# Patient Record
Sex: Male | Born: 1965 | Race: White | Hispanic: No | Marital: Married | Smoking: Never smoker
Health system: Southern US, Community
[De-identification: ages and names within clinical notes are randomized; demographics above are authoritative.]

## PROBLEM LIST (undated history)

## (undated) DIAGNOSIS — I10 Essential (primary) hypertension: Secondary | ICD-10-CM

## (undated) DIAGNOSIS — D126 Benign neoplasm of colon, unspecified: Secondary | ICD-10-CM

## (undated) HISTORY — DX: Essential (primary) hypertension: I10

## (undated) HISTORY — DX: Benign neoplasm of colon, unspecified: D12.6

---

## 1998-08-03 ENCOUNTER — Encounter: Payer: Self-pay | Admitting: Emergency Medicine

## 1998-08-03 ENCOUNTER — Emergency Department (HOSPITAL_COMMUNITY): Admission: EM | Admit: 1998-08-03 | Discharge: 1998-08-03 | Payer: Self-pay | Admitting: Emergency Medicine

## 1998-08-05 ENCOUNTER — Emergency Department (HOSPITAL_COMMUNITY): Admission: EM | Admit: 1998-08-05 | Discharge: 1998-08-05 | Payer: Self-pay | Admitting: Emergency Medicine

## 2001-12-04 ENCOUNTER — Encounter: Payer: Self-pay | Admitting: Family Medicine

## 2001-12-04 ENCOUNTER — Ambulatory Visit (HOSPITAL_COMMUNITY): Admission: RE | Admit: 2001-12-04 | Discharge: 2001-12-04 | Payer: Self-pay | Admitting: Family Medicine

## 2003-11-22 ENCOUNTER — Emergency Department (HOSPITAL_COMMUNITY): Admission: EM | Admit: 2003-11-22 | Discharge: 2003-11-22 | Payer: Self-pay | Admitting: Emergency Medicine

## 2004-03-16 ENCOUNTER — Emergency Department (HOSPITAL_COMMUNITY): Admission: EM | Admit: 2004-03-16 | Discharge: 2004-03-16 | Payer: Self-pay | Admitting: Emergency Medicine

## 2012-09-23 DIAGNOSIS — I10 Essential (primary) hypertension: Secondary | ICD-10-CM

## 2012-09-23 HISTORY — DX: Essential (primary) hypertension: I10

## 2014-02-15 ENCOUNTER — Encounter (HOSPITAL_COMMUNITY): Payer: Self-pay | Admitting: Emergency Medicine

## 2014-02-15 ENCOUNTER — Emergency Department (HOSPITAL_COMMUNITY)
Admission: EM | Admit: 2014-02-15 | Discharge: 2014-02-15 | Disposition: A | Discharge status: Home / Self Care | Payer: BC Managed Care – PPO | Attending: Emergency Medicine | Admitting: Emergency Medicine

## 2014-02-15 ENCOUNTER — Emergency Department (HOSPITAL_COMMUNITY): Payer: BC Managed Care – PPO

## 2014-02-15 DIAGNOSIS — R1032 Left lower quadrant pain: Secondary | ICD-10-CM | POA: Insufficient documentation

## 2014-02-15 DIAGNOSIS — R109 Unspecified abdominal pain: Secondary | ICD-10-CM

## 2014-02-15 DIAGNOSIS — Z880 Allergy status to penicillin: Secondary | ICD-10-CM | POA: Insufficient documentation

## 2014-02-15 LAB — COMPREHENSIVE METABOLIC PANEL
ALBUMIN: 3.9 g/dL (ref 3.5–5.2)
ALT: 30 U/L (ref 0–53)
AST: 24 U/L (ref 0–37)
Alkaline Phosphatase: 66 U/L (ref 39–117)
BUN: 16 mg/dL (ref 6–23)
CALCIUM: 9.3 mg/dL (ref 8.4–10.5)
CO2: 21 mEq/L (ref 19–32)
CREATININE: 0.83 mg/dL (ref 0.50–1.35)
Chloride: 104 mEq/L (ref 96–112)
GFR calc Af Amer: >90 mL/min (ref >90)
GFR calc non Af Amer: >90 mL/min (ref >90)
Glucose, Bld: 106 mg/dL — ABNORMAL HIGH (ref 70–99)
Potassium: 4.7 mEq/L (ref 3.7–5.3)
Sodium: 139 mEq/L (ref 137–147)
Total Bilirubin: 0.3 mg/dL (ref 0.3–1.2)
Total Protein: 7.2 g/dL (ref 6.0–8.3)

## 2014-02-15 LAB — CBC WITH DIFFERENTIAL/PLATELET
BASOS ABS: 0 10*3/uL (ref 0.0–0.1)
BASOS PCT: 1 % (ref 0–1)
Eosinophils Absolute: 0.1 10*3/uL (ref 0.0–0.7)
Eosinophils Relative: 1 % (ref 0–5)
HEMATOCRIT: 43.4 % (ref 39.0–52.0)
Hemoglobin: 15.3 g/dL (ref 13.0–17.0)
Lymphocytes Relative: 35 % (ref 12–46)
Lymphs Abs: 2 10*3/uL (ref 0.7–4.0)
MCH: 29.7 pg (ref 26.0–34.0)
MCHC: 35.3 g/dL (ref 30.0–36.0)
MCV: 84.1 fL (ref 78.0–100.0)
Monocytes Absolute: 0.4 10*3/uL (ref 0.1–1.0)
Monocytes Relative: 7 % (ref 3–12)
Neutro Abs: 3.3 10*3/uL (ref 1.7–7.7)
Neutrophils Relative %: 56 % (ref 43–77)
Platelets: 160 10*3/uL (ref 150–400)
RBC: 5.16 MIL/uL (ref 4.22–5.81)
RDW: 13.1 % (ref 11.5–15.5)
WBC: 5.8 10*3/uL (ref 4.0–10.5)

## 2014-02-15 LAB — URINALYSIS, ROUTINE W REFLEX MICROSCOPIC
Bilirubin Urine: NEGATIVE
GLUCOSE, UA: NEGATIVE mg/dL
Hgb urine dipstick: NEGATIVE
Ketones, ur: NEGATIVE mg/dL
LEUKOCYTES UA: NEGATIVE
Nitrite: NEGATIVE
PH: 7.5 (ref 5.0–8.0)
PROTEIN: NEGATIVE mg/dL
Specific Gravity, Urine: 1.015 (ref 1.005–1.030)
Urobilinogen, UA: 1 mg/dL (ref 0.0–1.0)

## 2014-02-15 LAB — LIPASE, BLOOD: Lipase: 22 U/L (ref 11–59)

## 2014-02-15 MED ORDER — IOHEXOL 300 MG/ML  SOLN
25.0000 mL | INTRAMUSCULAR | Status: DC | PRN
  Administered 2014-02-15: 25 mL via ORAL

## 2014-02-15 MED ORDER — IOHEXOL 300 MG/ML  SOLN
100.0000 mL | Freq: Once | INTRAMUSCULAR | Status: AC | PRN
  Administered 2014-02-15: 100 mL via INTRAVENOUS

## 2014-02-15 MED ORDER — NAPROXEN 500 MG PO TABS: 500.0000 mg | ORAL_TABLET | Freq: Two times a day (BID) | ORAL | Status: AC

## 2014-02-15 NOTE — ED Notes (Signed)
Pt to ED c/o of L groin pain radiating to L flank and back x 2 days. Pt reports sudden sharp pain on Friday that has been intermittent since then. Denies urinary issues

## 2014-02-15 NOTE — ED Provider Notes (Signed)
Medical screening examination/treatment/procedure(s) were performed by non-physician practitioner and as supervising physician I was immediately available for consultation/collaboration.   EKG Interpretation None        Hoy Morn, MD 02/15/14 1344

## 2014-02-15 NOTE — ED Provider Notes (Signed)
CSN: [id number]     Arrival date & time 02/15/14  3235 History   First MD Initiated Contact with Patient 02/15/14 [phone]     Chief Complaint  Patient presents with  . Flank Pain     (Consider location/radiation/quality/duration/timing/severity/associated sxs/prior Treatment) HPI Comments: Patient with h./o GERD -- presents with complaint of left flank and left lower abdominal pain for the past 3 days. Patient states that the pain came on gradually (contradicting the nursing note). Patient has some pain with having a bowel movement but the pain is improved once bowel movement has passed. No blood noted in stool, no melena. No urinary symptoms including dysuria or hematuria. No fever, nausea, vomiting. Patient had a colonoscopy 2 years ago which was negative except for a polyp. He does not remember being told that he had diverticulosis. Pain is made worse with movement and palpation. Course is constant. Over-the-counter Tylenol and ibuprofen has not helped the pain.  The history is provided by the patient.    History reviewed. No pertinent past medical history. History reviewed. No pertinent past surgical history. History reviewed. No pertinent family history. History  Substance Use Topics  . Smoking status: Never Smoker   . Smokeless tobacco: Not on file  . Alcohol Use: No    Review of Systems  Constitutional: Negative for fever.  HENT: Negative for rhinorrhea and sore throat.   Eyes: Negative for redness.  Respiratory: Negative for cough.   Cardiovascular: Negative for chest pain.  Gastrointestinal: Positive for abdominal pain. Negative for nausea, vomiting, diarrhea and blood in stool.  Genitourinary: Positive for flank pain. Negative for dysuria.  Musculoskeletal: Negative for myalgias.  Skin: Negative for rash.  Neurological: Negative for headaches.      Allergies  Penicillins  Home Medications   Prior to Admission medications   Not on File   BP 154/84  Pulse 76   Temp(Src) 97.6 F (36.4 C) (Oral)  Resp 18  Ht 5\' 9"  (1.753 m)  Wt 242 lb (109.77 kg)  BMI 35.72 kg/m2  SpO2 92%  Physical Exam  Nursing note and vitals reviewed. Constitutional: He appears well-developed and well-nourished.  HENT:  Head: Normocephalic and atraumatic.  Eyes: Conjunctivae are normal. Right eye exhibits no discharge. Left eye exhibits no discharge.  Neck: Normal range of motion. Neck supple.  Cardiovascular: Normal rate, regular rhythm and normal heart sounds.   Pulmonary/Chest: Effort normal and breath sounds normal.  Abdominal: Soft. There is tenderness (moderate) in the left lower quadrant. There is no rigidity, no rebound, no guarding, no CVA tenderness, no tenderness at McBurney's point and negative Murphy's sign.  Musculoskeletal:       Thoracic back: Normal. He exhibits normal range of motion, no tenderness and no bony tenderness.       Lumbar back: Normal. He exhibits normal range of motion, no tenderness and no bony tenderness.  Neurological: He is alert.  Skin: Skin is warm and dry.  Psychiatric: He has a normal mood and affect.    ED Course  Procedures (including critical care time) Labs Review Labs Reviewed  COMPREHENSIVE METABOLIC PANEL - Abnormal; Notable for the following:    Glucose, Bld 106 (*)    All other components within normal limits  CBC WITH DIFFERENTIAL  URINALYSIS, ROUTINE W REFLEX MICROSCOPIC  LIPASE, BLOOD    Imaging Review Ct Abdomen Pelvis W Contrast  02/15/2014   CLINICAL DATA:  Left groin pain radiating to left flank and back for 2 days, with left lower quadrant  pain intermittently for 2 days, concern for diverticulitis  EXAM: CT ABDOMEN AND PELVIS WITH CONTRAST  TECHNIQUE: Multidetector CT imaging of the abdomen and pelvis was performed using the standard protocol following bolus administration of intravenous contrast.  CONTRAST:  166mL OMNIPAQUE IOHEXOL 300 MG/ML  SOLN  COMPARISON:  None.  FINDINGS: Gallbladder is normal. Image  number 12 lateral right lobe of the liver there is a 11 mm low-attenuation lesion, too small to characterize. Spleen is normal. Pancreas is normal.  There is a 6 mm left adrenal nodule, likely benign. Right adrenal is normal. Left kidney is normal with no stones or hydronephrosis. Right kidney is also normal.  Abdominal aorta shows mild calcification with no of patient. Bladder is normal. Reproductive organs are normal.  There is no ascites. There are several retroperitoneal lymph nodes, mesenteric lymph nodes as well as lymph nodes in the region of the porta hepatis and celiac axis. The largest is in the region of the portal vein measuring 14 mm.  The visualized portions of the lung bases are clear. There are no acute musculoskeletal findings.  Bowel is normal. There is mild sigmoid colon diverticulosis with no evidence of diverticulitis. Appendix is normal.  IMPRESSION: No acute abnormality.  Incidental 5 mm region of the liver not fully characterized but likely benign.  11 mm left adrenal apex nodule, likely benign. If there is no history of malignancy, a follow-up CT scan or MRI of the adrenal glands in 1 year could be considered. If there is any history of malignancy, this could be further investigated at this time with nonemergent outpatient adrenal MRI. This recommendation follows ACR consensus guidelines: Managing Incidental Findings on Abdominal CT: White Paper of the ACR Incidental Findings Committee. J Am Coll Radiol 2010;7:754-773  Nonspecific abdominal adenopathy.   Electronically Signed   By: Skipper Cliche M.D.   On: 02/15/2014 11:42     EKG Interpretation None       9:48 AM Patient seen and examined. Work-up initiated. Medications refused. CT ordered.   Vital signs reviewed and are as follows: Filed Vitals:   02/15/14 0933  BP: 154/84  Pulse: 76  Temp: 97.6 F (36.4 C)  Resp: 18   12:17 PM the patient's symptoms and exam are stable. Patient informed of lab results and CT results.  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 He sees clinic in [place]. Will discharge to home with anti-inflammatories. Patient counseled to use heating pad if desired on tender area. Discussed that he should followup with his PCP for a recheck of his symptoms the next 2-3 days. The patient was urged to return to the Emergency Department immediately with worsening of current symptoms, worsening abdominal pain, persistent vomiting, blood noted in stools, fever, or any other concerns. The patient verbalized understanding.    MDM   Final diagnoses:  Abdominal pain   Patient with left flank and lower abdominal pain. Labs are reassuring. No leukocytosis. CT scan performed -- it does not demonstrate ureteral stone, diverticulitis, AAA, or other serious cause of pain. Vitals are stable, no fever.  No signs of dehydration, tolerating PO's. Lungs are clear. No concern for appendicitis, cholecystitis, pancreatitis, ruptured viscus, UTI, kidney stone, or any other serious abdominal etiology based on work-up however patient counseled on need to f/u, return if worsening. Supportive therapy indicated with return if symptoms worsen. Patient counseled.  No dangerous or life-threatening conditions suspected or identified by history, physical exam, and by work-up. No indications for hospitalization identified.  Carlisle Cater, PA-C 02/15/14 [phone]

## 2014-02-15 NOTE — Discharge Instructions (Signed)
Please read and follow all provided instructions.  Your diagnoses today include:  1. Abdominal pain     Tests performed today include:  Blood counts and electrolytes  Blood tests to check liver and kidney function  Blood tests to check pancreas function  Urine test to look for infection and pregnancy (in women)  Vital signs. See below for your results today.   Medications prescribed:   Naproxen - anti-inflammatory pain medication  Do not exceed 500mg  naproxen every 12 hours, take with food  You have been prescribed an anti-inflammatory medication or NSAID. Take with food. Take smallest effective dose for the shortest duration needed for your pain. Stop taking if you experience stomach pain or vomiting.   Take any prescribed medications only as directed.  Home care instructions:   Follow any educational materials contained in this packet.  Follow-up instructions: Please follow-up with your primary care provider in the next 3 days for further evaluation of your symptoms.   Return instructions:  SEEK IMMEDIATE MEDICAL ATTENTION IF:  The pain does not go away or becomes severe   A temperature above 101F develops   Repeated vomiting occurs (multiple episodes)   The pain becomes localized to portions of the abdomen. The right side could possibly be appendicitis. In an adult, the left lower portion of the abdomen could be colitis or diverticulitis.   Blood is being passed in stools or vomit (bright red or black tarry stools)   You develop chest pain, difficulty breathing, dizziness or fainting, or become confused, poorly responsive, or inconsolable (young children)  If you have any other emergent concerns regarding your health  Additional Information: Abdominal (belly) pain can be caused by many things. Your caregiver performed an examination and possibly ordered blood/urine tests and imaging (CT scan, x-rays, ultrasound). Many cases can be observed and treated at home  after initial evaluation in the emergency department. Even though you are being discharged home, abdominal pain can be unpredictable. Therefore, you need a repeated exam if your pain does not resolve, returns, or worsens. Most patients with abdominal pain don't have to be admitted to the hospital or have surgery, but serious problems like appendicitis and gallbladder attacks can start out as nonspecific pain. Many abdominal conditions cannot be diagnosed in one visit, so follow-up evaluations are very important.  Your vital signs today were: BP 155/91   Pulse 82   Temp(Src) 97.6 F (36.4 C) (Oral)   Resp 18   Ht 5\' 9"  (1.753 m)   Wt 242 lb (109.77 kg)   BMI 35.72 kg/m2   SpO2 93% If your blood pressure (bp) was elevated above 135/85 this visit, please have this repeated by your doctor within one month. --------------

## 2015-03-30 ENCOUNTER — Emergency Department (HOSPITAL_COMMUNITY)
Admission: EM | Admit: 2015-03-30 | Discharge: 2015-03-30 | Disposition: A | Discharge status: Home / Self Care | Payer: BLUE CROSS/BLUE SHIELD | Attending: Emergency Medicine | Admitting: Emergency Medicine

## 2015-03-30 ENCOUNTER — Encounter (HOSPITAL_COMMUNITY): Payer: Self-pay | Admitting: Emergency Medicine

## 2015-03-30 DIAGNOSIS — Z791 Long term (current) use of non-steroidal anti-inflammatories (NSAID): Secondary | ICD-10-CM | POA: Insufficient documentation

## 2015-03-30 DIAGNOSIS — R519 Headache, unspecified: Secondary | ICD-10-CM

## 2015-03-30 DIAGNOSIS — R51 Headache: Secondary | ICD-10-CM | POA: Insufficient documentation

## 2015-03-30 MED ORDER — PROCHLORPERAZINE EDISYLATE 5 MG/ML IJ SOLN
10.0000 mg | Freq: Once | INTRAMUSCULAR | Status: AC
  Administered 2015-03-30: 10 mg via INTRAVENOUS
  Filled 2015-03-30: qty 2

## 2015-03-30 MED ORDER — METOCLOPRAMIDE HCL 5 MG/ML IJ SOLN
10.0000 mg | INTRAMUSCULAR | Status: AC
  Administered 2015-03-30: 10 mg via INTRAVENOUS
  Filled 2015-03-30: qty 2

## 2015-03-30 MED ORDER — ONDANSETRON 4 MG PO TBDP: 4.0000 mg | ORAL_TABLET | Freq: Three times a day (TID) | ORAL | Status: AC | PRN

## 2015-03-30 MED ORDER — DEXAMETHASONE SODIUM PHOSPHATE 10 MG/ML IJ SOLN
10.0000 mg | Freq: Once | INTRAMUSCULAR | Status: AC
  Administered 2015-03-30: 10 mg via INTRAVENOUS
  Filled 2015-03-30: qty 1

## 2015-03-30 MED ORDER — DIPHENHYDRAMINE HCL 50 MG/ML IJ SOLN
25.0000 mg | Freq: Once | INTRAMUSCULAR | Status: AC
  Administered 2015-03-30: 25 mg via INTRAVENOUS
  Filled 2015-03-30: qty 1

## 2015-03-30 MED ORDER — BUTALBITAL-APAP-CAFFEINE 50-325-40 MG PO TABS: 1.0000 | ORAL_TABLET | Freq: Four times a day (QID) | ORAL | Status: AC | PRN

## 2015-03-30 MED ORDER — KETOROLAC TROMETHAMINE 30 MG/ML IJ SOLN
30.0000 mg | Freq: Once | INTRAMUSCULAR | Status: AC
  Administered 2015-03-30: 30 mg via INTRAVENOUS
  Filled 2015-03-30: qty 1

## 2015-03-30 MED ORDER — SODIUM CHLORIDE 0.9 % IV BOLUS (SEPSIS)
1000.0000 mL | Freq: Once | INTRAVENOUS | Status: AC
  Administered 2015-03-30: 1000 mL via INTRAVENOUS

## 2015-03-30 NOTE — ED Notes (Signed)
Pt. Stated, I started having right side pain 3 days ago and its becoming constant.

## 2015-03-30 NOTE — ED Provider Notes (Signed)
CSN: [id number]     Arrival date & time 03/30/15  1419 History   First MD Initiated Contact with Patient 03/30/15 1748     Chief Complaint  Patient presents with  . Headache     (Consider location/radiation/quality/duration/timing/severity/associated sxs/prior Treatment) Patient is a 49 y.o. male presenting with headaches. The history is provided by the patient and medical records.  Headache  This is a 49 year old male with no significant past medical history presenting to the ED for headache. Patient states headache initially began 3 days ago, localized to the right side of his head. He states pain was initially intermittent, has since become constant. Pain is described as throbbing, with occasional stabbing sensations. He reports associated nausea without vomiting. He denies any dizziness, lightheadedness, focal numbness or weakness, gait disturbance, visual disturbance, confusion, or changes in speech. He has no fever, chills, or neck pain. No recent head injuries or trauma. Patient is not currently on any type of anticoagulation. No former history of migraines. Patient has been taking Motrin without relief, last dose taken yesterday.  VSS.  History reviewed. No pertinent past medical history. History reviewed. No pertinent past surgical history. No family history on file. History  Substance Use Topics  . Smoking status: Never Smoker   . Smokeless tobacco: Not on file  . Alcohol Use: No    Review of Systems  Neurological: Positive for headaches.  All other systems reviewed and are negative.     Allergies  Penicillins  Home Medications   Prior to Admission medications   Medication Sig Start Date End Date Taking? Authorizing Provider  lansoprazole (PREVACID) 30 MG capsule Take 30 mg by mouth daily as needed (for acid reflux).    Historical Provider, MD  naproxen (NAPROSYN) 500 MG tablet Take 1 tablet (500 mg total) by mouth 2 (two) times daily. 02/15/14   Carlisle Cater, PA-C    BP 138/90 mmHg  Pulse 73  Temp(Src) 97.7 F (36.5 C) (Oral)  Resp 16  Ht 5\' 9"  (1.753 m)  Wt 240 lb (108.863 kg)  BMI 35.43 kg/m2  SpO2 95%   Physical Exam  Constitutional: He is oriented to person, place, and time. He appears well-developed and well-nourished. No distress.  HENT:  Head: Normocephalic and atraumatic.  Mouth/Throat: Oropharynx is clear and moist.  Eyes: Conjunctivae and EOM are normal. Pupils are equal, round, and reactive to light.  Neck: Normal range of motion and full passive range of motion without pain. Neck supple. No spinous process tenderness and no muscular tenderness present. No rigidity.  No rigidity, full range of motion  Cardiovascular: Normal rate, regular rhythm and normal heart sounds.   Pulmonary/Chest: Effort normal and breath sounds normal. No respiratory distress. He has no wheezes.  Abdominal: Soft. Bowel sounds are normal. There is no tenderness. There is no guarding.  Musculoskeletal: Normal range of motion.  Neurological: He is alert and oriented to person, place, and time.  AAOx3, answering questions and following commands appropriately; equal strength UE and LE bilaterally; CN grossly intact; moves all extremities appropriately without ataxia; normal coordination; no pronator drift; no focal neuro deficits or facial asymmetry appreciated  Skin: Skin is warm and dry. He is not diaphoretic.  Psychiatric: He has a normal mood and affect.  Nursing note and vitals reviewed.   ED Course  Procedures (including critical care time) Labs Review Labs Reviewed - No data to display  Imaging Review No results found.   EKG Interpretation None      MDM  Final diagnoses:  Headache, unspecified headache type   49 year old male here with right-sided headache for the past 3 days. Progressively worsening in nature. Currently described as throbbing sensation with nausea. No history of migraines. Patient is afebrile, nontoxic. His neurologic exam  is nonfocal. No clinical signs of meningitis.  At this time low suspicion for acute intracranial pathology including TIA, stroke, ICH, SAH, or meningitis.  Patient will be given migraine cocktail, will reassess.  After migraine cocktail, patient reports no relief. He was given additional Toradol and Compazine, again without relief. He has been intermittently sleeping.  VS remain stable. His neurologic exam remains nonfocal, however given his refractory headache to interventions tried thus far must consider other etiologies.  CT head was offered to patient to evaluate for other etiology of this headache, however he declined.  He states he would like to go home and wait another day or two before pursuing imaging.  Feel this is reasonable.  Wife also now informs me that patient works full time job as well as Education administrator-- she wonders if stress playing role which is possible.  Will d/c home with fioricet.  Encouraged close FU with PCP.  Warning signs/symptoms that would warrant immediate ED return including numbness, weakness, confusion, dizziness, lethargy, high fever, etc., were discussed with patient and wife, they acknowledged understanding and are comfortable with plan of care.   Larene Pickett, PA-C 03/30/15 [place], MD 03/31/15 1452

## 2015-03-30 NOTE — Discharge Instructions (Signed)
Take the prescribed medication as directed. Follow-up with your primary care physician. Return to the ED for new or worsening symptoms-- numbness, weakness, dizziness, confusion, decreased responsiveness, difficulty walking, changes in speech, etc.

## 2015-04-14 DIAGNOSIS — D126 Benign neoplasm of colon, unspecified: Secondary | ICD-10-CM

## 2015-04-14 HISTORY — DX: Benign neoplasm of colon, unspecified: D12.6

## 2015-11-13 IMAGING — CT CT ABD-PELV W/ CM
2 of 5 series · 4 of 46 positions shown, 6 images · IV contrast (Iodine)
Comparison: None.

CLINICAL DATA: Left groin pain radiating to left flank and back for
2 days, with left lower quadrant pain intermittently for 2 days,
concern for diverticulitis

EXAM:
CT ABDOMEN AND PELVIS WITH CONTRAST
TECHNIQUE: Multidetector CT imaging of the abdomen and pelvis was performed
using the standard protocol following bolus administration of
intravenous contrast.
CONTRAST:  100mL OMNIPAQUE IOHEXOL 300 MG/ML  SOLN

[Series 206: coronal · coronal · 0.50mm/px · 3 of 117 slices shown, 4 images]
[im 26/117  soft-tissue]
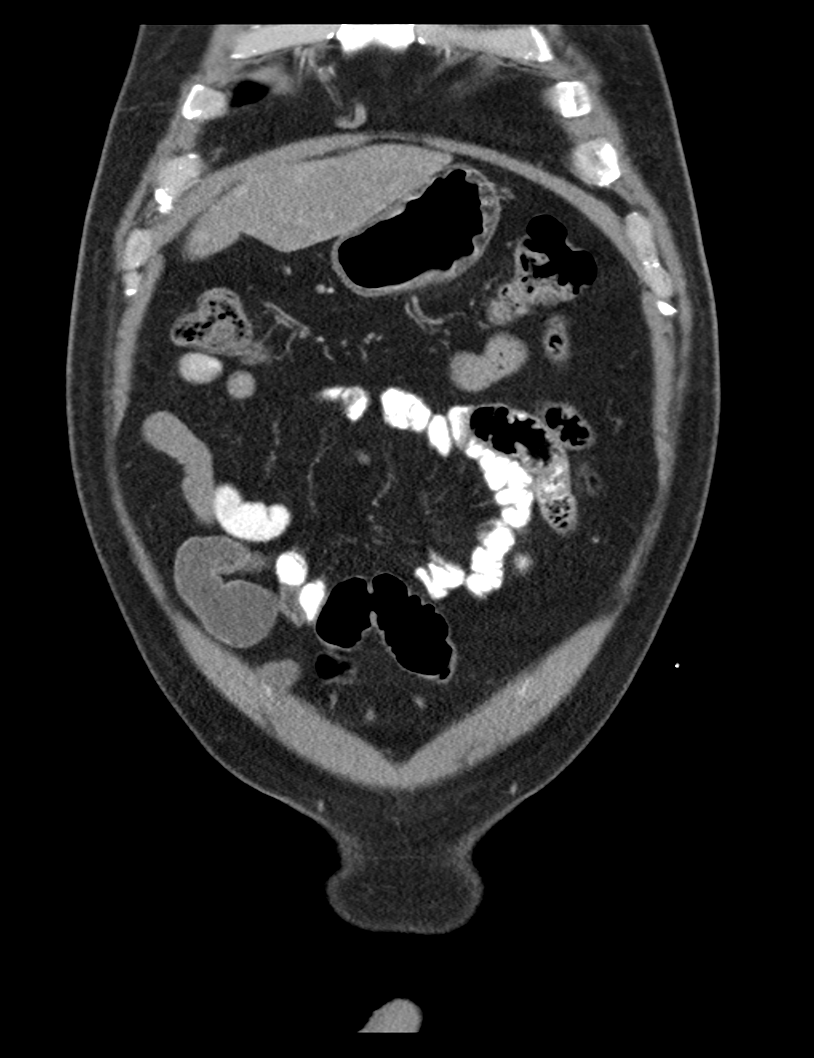
[im 26/117  bone]
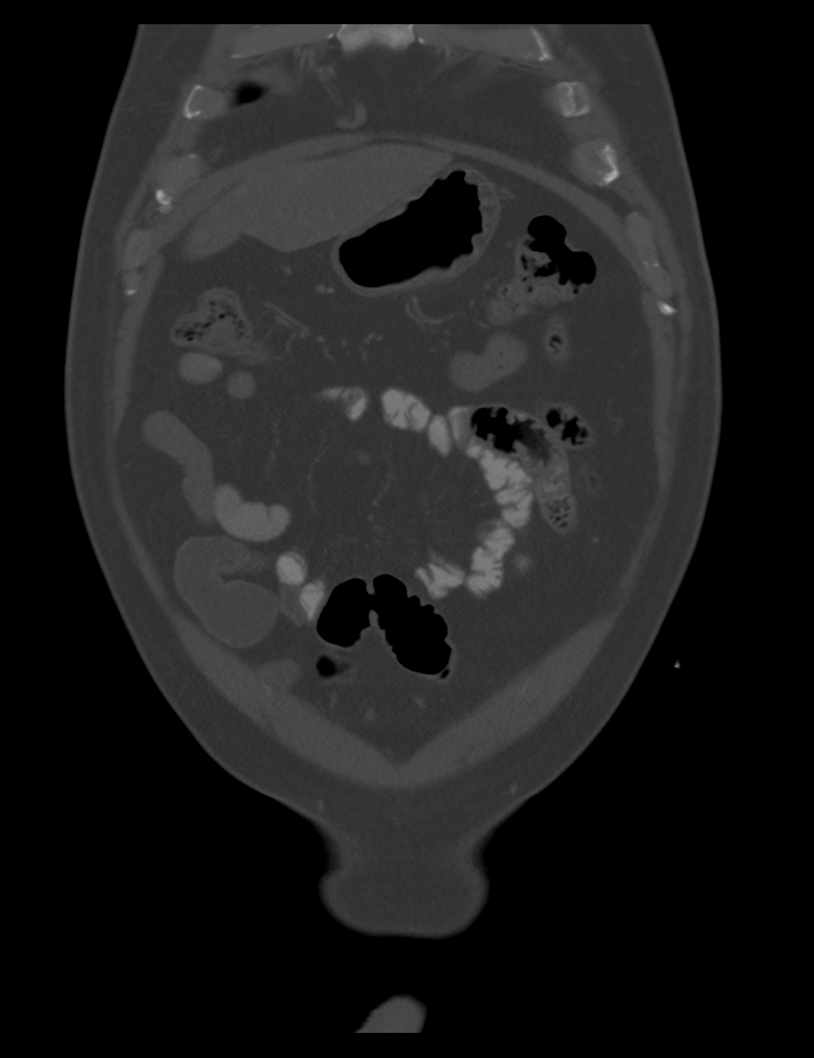
[im 65/117  soft-tissue]
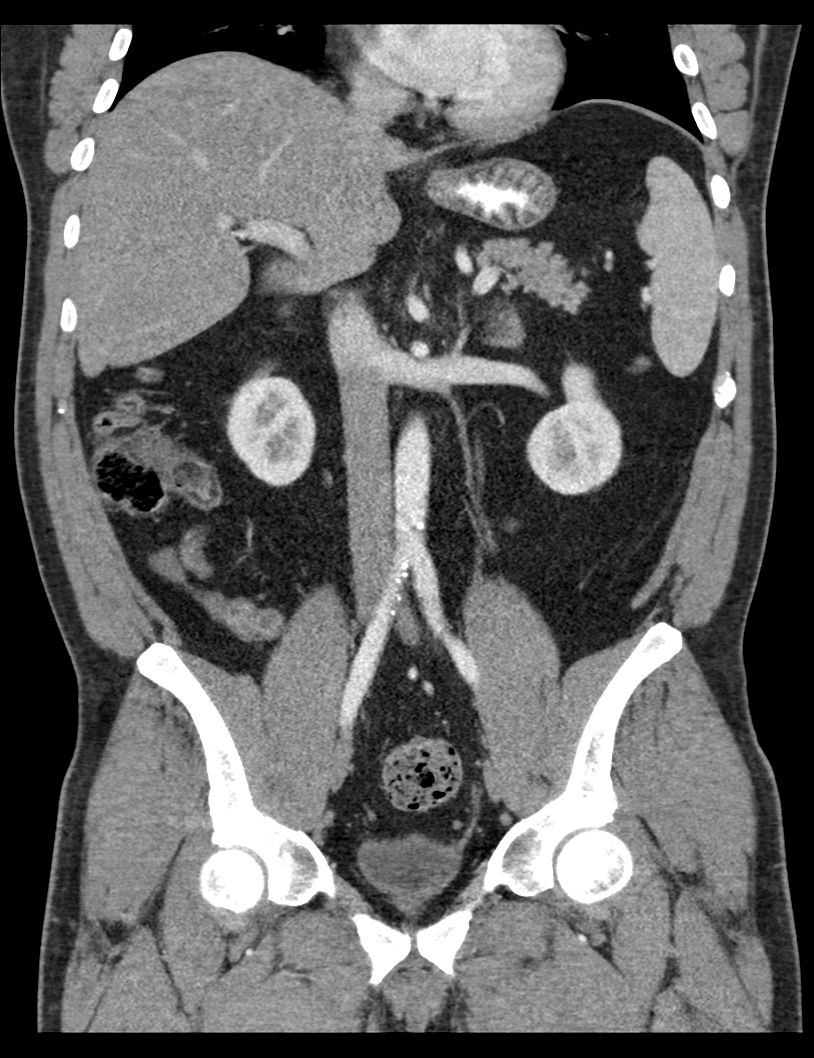
[im 91/117  soft-tissue]
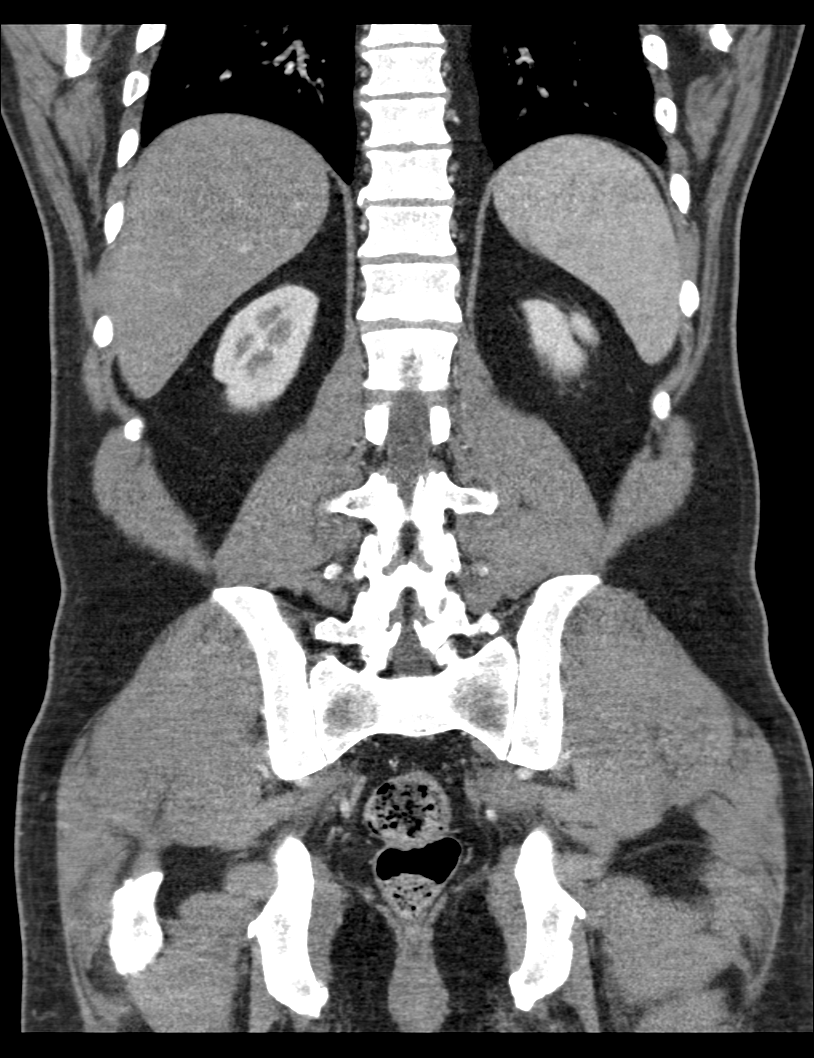

[Series 207: sagittal · sagittal · 0.50mm/px · 1 of 145 slices shown, 2 images]
[im 49/145  soft-tissue]
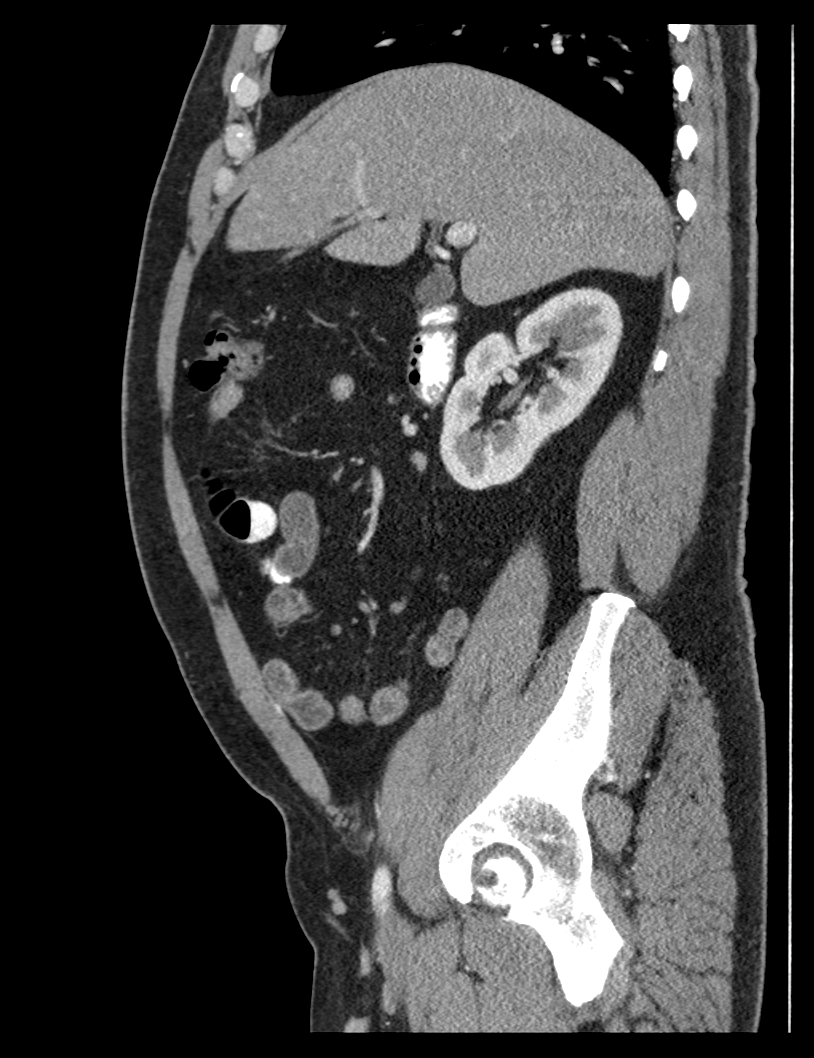
[im 49/145  bone]
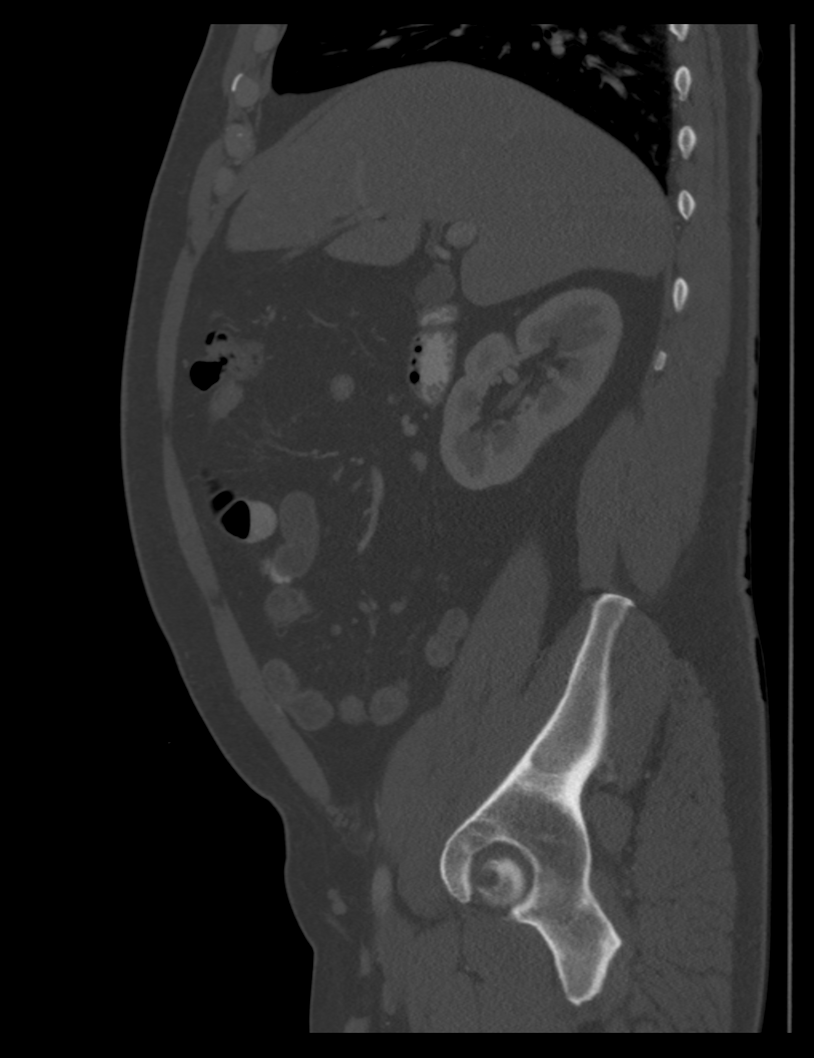

[4 of 46 positions shown; findings below may reference images not displayed]

FINDINGS: Gallbladder is normal. Image number 12 lateral right lobe of the
liver there is a 11 mm low-attenuation lesion, too small to
characterize. Spleen is normal. Pancreas is normal.

There is a 6 mm left adrenal nodule, likely benign. Right adrenal is
normal. Left kidney is normal with no stones or hydronephrosis.
Right kidney is also normal.

Abdominal aorta shows mild calcification with no of patient. Bladder
is normal. Reproductive organs are normal.

There is no ascites. There are several retroperitoneal lymph nodes,
mesenteric lymph nodes as well as lymph nodes in the region of the
porta hepatis and celiac axis. The largest is in the region of the
portal vein measuring 14 mm.

The visualized portions of the lung bases are clear. There are no
acute musculoskeletal findings.

Bowel is normal. There is mild sigmoid colon diverticulosis with no
evidence of diverticulitis. Appendix is normal.
IMPRESSION: No acute abnormality.

Incidental 5 mm region of the liver not fully characterized but
likely benign.

11 mm left adrenal apex nodule, likely benign. If there is no
history of malignancy, a follow-up CT scan or MRI of the adrenal
glands in 1 year could be considered. If there is any history of
malignancy, this could be further investigated at this time with
nonemergent outpatient adrenal MRI. This recommendation follows ACR
consensus guidelines: Managing Incidental Findings on Abdominal CT:
White Paper of the ACR Incidental Findings Committee. [HOSPITAL] 0363;[DATE]

Nonspecific abdominal adenopathy.

## 2017-01-24 ENCOUNTER — Ambulatory Visit (INDEPENDENT_AMBULATORY_CARE_PROVIDER_SITE_OTHER): Payer: BLUE CROSS/BLUE SHIELD | Admitting: Family Medicine

## 2017-01-24 ENCOUNTER — Encounter: Payer: Self-pay | Admitting: Family Medicine

## 2017-01-24 DIAGNOSIS — M65311 Trigger thumb, right thumb: Secondary | ICD-10-CM

## 2017-01-24 NOTE — Patient Instructions (Signed)
Thank you for coming in today. Call or go to the ER if you develop a large red swollen joint with extreme pain or oozing puss.  Recheck in 4 weeks if not better.  Take it easy for a few days.  Use the double bandaid splint.  Return as needed.    Trigger Finger Trigger finger (stenosing tenosynovitis) is a condition that causes a finger to get stuck in a bent position. Each finger has a tough, cord-like tissue that connects muscle to bone (tendon), and each tendon is surrounded by a tunnel of tissue (tendon sheath). To move your finger, your tendon needs to slide freely through the sheath. Trigger finger happens when the tendon or the sheath thickens, making it difficult to move your finger. Trigger finger can affect any finger or a thumb. It may affect more than one finger. Mild cases may clear up with rest and medicine. Severe cases require more treatment. What are the causes? Trigger finger is caused by a thickened finger tendon or tendon sheath. The cause of this thickening is not known. What increases the risk? The following factors may make you more likely to develop this condition:  Doing activities that require a strong grip.  Having rheumatoid arthritis, gout, or diabetes.  Being 67-29 years old.  Being a woman. What are the signs or symptoms? Symptoms of this condition include:  Pain when bending or straightening your finger.  Tenderness or swelling where your finger attaches to the palm of your hand.  A lump in the palm of your hand or on the inside of your finger.  Hearing a popping sound when you try to straighten your finger.  Feeling a popping, catching, or locking sensation when you try to straighten your finger.  Being unable to straighten your finger. How is this diagnosed? This condition is diagnosed based on your symptoms and a physical exam. How is this treated? This condition may be treated by:  Resting your finger and avoiding activities that make  symptoms worse.  Wearing a finger splint to keep your finger in a slightly bent position.  Taking NSAIDs to relieve pain and swelling.  Injecting medicine (steroids) into the tendon sheath to reduce swelling and irritation. Injections may need to be repeated.  Having surgery to open the tendon sheath. This may be done if other treatments do not work and you cannot straighten your finger. You may need physical therapy after surgery. Follow these instructions at home:  Use moist heat to help reduce pain and swelling as told by your health care provider.  Rest your finger and avoid activities that make pain worse. Return to normal activities as told by your health care provider.  If you have a splint, wear it as told by your health care provider.  Take over-the-counter and prescription medicines only as told by your health care provider.  Keep all follow-up visits as told by your health care provider. This is important. Contact a health care provider if:  Your symptoms are not improving with home care. Summary  Trigger finger (stenosing tenosynovitis) causes your finger to get stuck in a bent position, and it can make it difficult and painful to straighten your finger.  This condition develops when a finger tendon or tendon sheath thickens.  Treatment starts with resting, wearing a splint, and taking NSAIDs.  In severe cases, surgery to open the tendon sheath may be needed. This information is not intended to replace advice given to you by your health care provider.  Make sure you discuss any questions you have with your health care provider. Document Released: 06/24/2004 Document Revised: 08/15/2016 Document Reviewed: 08/15/2016 Elsevier Interactive Patient Education  2017 [employer].

## 2017-01-25 ENCOUNTER — Encounter: Payer: Self-pay | Admitting: Family Medicine

## 2017-01-25 DIAGNOSIS — M65311 Trigger thumb, right thumb: Secondary | ICD-10-CM | POA: Insufficient documentation

## 2017-01-25 NOTE — Progress Notes (Signed)
Jeremy Mccarty is a 51 y.o. male who presents to [place] today for left thumb triggering. Patient notes pain in his left thumb associated with the interphalangeal joint being stuck in a flexed position. This is been ongoing for around 3 weeks. Patient denies any injury. He works a heavy Retail buyer job. He has tried massage and rest which have not helped. Additionally she's tried some over-the-counter medicines for pain which have not helped much either.   Past Medical History:  Diagnosis Date  . Adenomatous colon polyp 04/14/2015   Overview:  Colonoscopy 03/2015 Adenomatous polyp 5 yr recall Mixon/GAP  . Hypertension 09/23/2012   No past surgical history on file. Social History  Substance Use Topics  . Smoking status: Never Smoker  . Smokeless tobacco: Not on file  . Alcohol use No   family history is not on file.  ROS:  No headache, visual changes, nausea, vomiting, diarrhea, constipation, dizziness, abdominal pain, skin rash, fevers, chills, night sweats, weight loss, swollen lymph nodes, body aches, joint swelling, muscle aches, chest pain, shortness of breath, mood changes, visual or auditory hallucinations.    Medications: Current Outpatient Prescriptions  Medication Sig Dispense Refill  . ibuprofen (ADVIL,MOTRIN) 800 MG tablet Take 800 mg by mouth every 6 (six) hours as needed for moderate pain.    Marland Kitchen lansoprazole (PREVACID) 30 MG capsule Take 30 mg by mouth daily as needed (for acid reflux).    . naproxen (NAPROSYN) 500 MG tablet Take 1 tablet (500 mg total) by mouth 2 (two) times daily. 20 tablet 0  . ondansetron (ZOFRAN ODT) 4 MG disintegrating tablet Take 1 tablet (4 mg total) by mouth every 8 (eight) hours as needed for nausea. 10 tablet 0  . amLODipine (NORVASC) 10 MG tablet Take 10 mg by mouth daily.     No current facility-administered medications for this visit.    Allergies  Allergen Reactions  . Penicillins    *makes skin on hands peel off*     Exam:  BP (!) 198/98   Pulse 67   Wt 247 lb (112 kg)   BMI 36.48 kg/m  General: Well Developed, well nourished, and in no acute distress.  Neuro/Psych: Alert and oriented x3, extra-ocular muscles intact, able to move all 4 extremities, sensation grossly intact. Skin: Warm and dry, no rashes noted.  Respiratory: Not using accessory muscles, speaking in full sentences, trachea midline.  Cardiovascular: Pulses palpable, no extremity edema. Abdomen: Does not appear distended. MSK: Right hand well-appearing. Patient has a tender nodule at the right thumb MCP and has triggering with flexion of and phalangeal joint. This is painful and reproduces pain. Pulses capillary refill sensation and strength are intact distally.  Procedure: Real-time Ultrasound Guided Injection of right thumb A1 pulley trigger finger injection  Device: GE Logiq E  Images permanently stored and available for review in the ultrasound unit. Verbal informed consent obtained. Discussed risks and benefits of procedure. Warned about infection bleeding damage to structures skin hypopigmentation and fat atrophy among others. Patient expresses understanding and agreement Time-out conducted.  Noted no overlying erythema, induration, or other signs of local infection.  Skin prepped in a sterile fashion.  Local anesthesia: Topical Ethyl chloride.  With sterile technique and under real time ultrasound guidance: 1.5 mL of lidocaine and 5 mg of dexamethasone injected easily.  Completed without difficulty  Pain immediately resolved suggesting accurate placement of the medication.  Advised to call if fevers/chills, erythema, induration, drainage, or persistent  bleeding.  Images permanently stored and available for review in the ultrasound unit.  Impression: Technically successful ultrasound guided injection.     No results found for this or any previous visit (from the past 48  hour(s)). No results found.    Assessment and Plan: 51 y.o. male with right hand trigger thumb status post injection today. Treat with double Band-Aid splint and relative rest. Return as needed.  Recommend patient follow-up with primary care provider regarding blood pressure.    No orders of the defined types were placed in this encounter.   Discussed warning signs or symptoms. Please see discharge instructions. Patient expresses understanding.

## 2017-02-21 ENCOUNTER — Ambulatory Visit (INDEPENDENT_AMBULATORY_CARE_PROVIDER_SITE_OTHER): Payer: BLUE CROSS/BLUE SHIELD | Admitting: Family Medicine

## 2017-02-21 VITALS — BP 160/90 | HR 85 | Wt 245.0 lb

## 2017-02-21 DIAGNOSIS — M65311 Trigger thumb, right thumb: Secondary | ICD-10-CM

## 2017-02-21 NOTE — Progress Notes (Signed)
   Jeremy Mccarty is a 51 y.o. male who presents to [place] today for follow-up right hand trigger thumb. Patient was seen May 9 for trigger thumb of the right hand. He received an ultrasound guided injection at that time. The pain relief and improvement in triggering only lasted a few days. The triggering has returned and has actually worsened since the last visit. He continues splinting which has not helped much.   Past Medical History:  Diagnosis Date  . Adenomatous colon polyp 04/14/2015   Overview:  Colonoscopy 03/2015 Adenomatous polyp 5 yr recall Mixon/GAP  . Hypertension 09/23/2012   No past surgical history on file. Social History  Substance Use Topics  . Smoking status: Never Smoker  . Smokeless tobacco: Not on file  . Alcohol use No     ROS:  As above   Medications: Current Outpatient Prescriptions  Medication Sig Dispense Refill  . amLODipine (NORVASC) 10 MG tablet Take 10 mg by mouth daily.    Marland Kitchen ibuprofen (ADVIL,MOTRIN) 800 MG tablet Take 800 mg by mouth every 6 (six) hours as needed for moderate pain.    Marland Kitchen lansoprazole (PREVACID) 30 MG capsule Take 30 mg by mouth daily as needed (for acid reflux).    . naproxen (NAPROSYN) 500 MG tablet Take 1 tablet (500 mg total) by mouth 2 (two) times daily. 20 tablet 0  . ondansetron (ZOFRAN ODT) 4 MG disintegrating tablet Take 1 tablet (4 mg total) by mouth every 8 (eight) hours as needed for nausea. 10 tablet 0   No current facility-administered medications for this visit.    Allergies  Allergen Reactions  . Penicillins     *makes skin on hands peel off*     Exam:  BP (!) 160/90   Pulse 85   Wt 245 lb (111.1 kg)   BMI 36.18 kg/m  General: Well Developed, well nourished, and in no acute distress.  Neuro/Psych: Alert and oriented x3, extra-ocular muscles intact, able to move all 4 extremities, sensation grossly intact. Skin: Warm and dry, no rashes noted.  Respiratory:  Not using accessory muscles, speaking in full sentences, trachea midline.  Cardiovascular: Pulses palpable, no extremity edema. Abdomen: Does not appear distended. MSK: Right hand normal-appearing with no significant swelling. Triggering with flexion and extension of right thumb interphalangeal joint present. Tender palpation at [employer].    No results found for this or any previous visit (from the past 48 hour(s)). No results found.    Assessment and Plan: 51 y.o. male with right hand trigger finger. Failed conservative management. Patient had such a quick relapse that I do not think that further injections are likely to help much. Plan refer to hand surgery    Orders Placed This Encounter  Procedures  . Ambulatory referral to Hand Surgery    Referral Priority:   Routine    Referral Type:   Surgical    Referral Reason:   Specialty Services Required    Requested Specialty:   Hand Surgery    Number of Visits Requested:   1   No orders of the defined types were placed in this encounter.   Discussed warning signs or symptoms. Please see discharge instructions. Patient expresses understanding.

## 2017-02-21 NOTE — Patient Instructions (Signed)
Thank you for coming in today. You should hear from hand surgery soon.  Let me know if you do not hear anything in the next few days.    Trigger Finger Trigger finger (stenosing tenosynovitis) is a condition that causes a finger to get stuck in a bent position. Each finger has a tough, cord-like tissue that connects muscle to bone (tendon), and each tendon is surrounded by a tunnel of tissue (tendon sheath). To move your finger, your tendon needs to slide freely through the sheath. Trigger finger happens when the tendon or the sheath thickens, making it difficult to move your finger. Trigger finger can affect any finger or a thumb. It may affect more than one finger. Mild cases may clear up with rest and medicine. Severe cases require more treatment. What are the causes? Trigger finger is caused by a thickened finger tendon or tendon sheath. The cause of this thickening is not known. What increases the risk? The following factors may make you more likely to develop this condition:  Doing activities that require a strong grip.  Having rheumatoid arthritis, gout, or diabetes.  Being 45-50 years old.  Being a woman.  What are the signs or symptoms? Symptoms of this condition include:  Pain when bending or straightening your finger.  Tenderness or swelling where your finger attaches to the palm of your hand.  A lump in the palm of your hand or on the inside of your finger.  Hearing a popping sound when you try to straighten your finger.  Feeling a popping, catching, or locking sensation when you try to straighten your finger.  Being unable to straighten your finger.  How is this diagnosed? This condition is diagnosed based on your symptoms and a physical exam. How is this treated? This condition may be treated by:  Resting your finger and avoiding activities that make symptoms worse.  Wearing a finger splint to keep your finger in a slightly bent position.  Taking NSAIDs to  relieve pain and swelling.  Injecting medicine (steroids) into the tendon sheath to reduce swelling and irritation. Injections may need to be repeated.  Having surgery to open the tendon sheath. This may be done if other treatments do not work and you cannot straighten your finger. You may need physical therapy after surgery.  Follow these instructions at home:  Use moist heat to help reduce pain and swelling as told by your health care provider.  Rest your finger and avoid activities that make pain worse. Return to normal activities as told by your health care provider.  If you have a splint, wear it as told by your health care provider.  Take over-the-counter and prescription medicines only as told by your health care provider.  Keep all follow-up visits as told by your health care provider. This is important. Contact a health care provider if:  Your symptoms are not improving with home care. Summary  Trigger finger (stenosing tenosynovitis) causes your finger to get stuck in a bent position, and it can make it difficult and painful to straighten your finger.  This condition develops when a finger tendon or tendon sheath thickens.  Treatment starts with resting, wearing a splint, and taking NSAIDs.  In severe cases, surgery to open the tendon sheath may be needed. This information is not intended to replace advice given to you by your health care provider. Make sure you discuss any questions you have with your health care provider. Document Released: 06/24/2004 Document Revised: 08/15/2016 Document Reviewed:  08/15/2016 Elsevier Interactive Patient Education  2017 [employer].

## 2023-07-26 ENCOUNTER — Encounter (HOSPITAL_COMMUNITY): Payer: Self-pay

## 2023-07-26 ENCOUNTER — Telehealth (HOSPITAL_COMMUNITY): Payer: Self-pay

## 2023-07-26 NOTE — Telephone Encounter (Signed)
Attempted to call patient in regards to Cardiac Rehab - LM on VM Mailed letter 

## 2023-07-26 NOTE — Telephone Encounter (Signed)
Outside/paper referral received by Dr. McCabe from [facility]. Will fax over Physician order and request further documents. Insurance benefits and eligibility to be determined.  ?

## 2023-08-28 ENCOUNTER — Telehealth (HOSPITAL_COMMUNITY): Payer: Self-pay

## 2023-08-28 NOTE — Telephone Encounter (Signed)
No response from in regards to Cardiac Rehab  Closed referral
